# Patient Record
Sex: Male | Born: 1995 | Hispanic: Yes | Marital: Single | Smoking: Never smoker
Health system: Southern US, Community
[De-identification: ages and names within clinical notes are randomized; demographics above are authoritative.]

---
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
## 2020-05-04 ENCOUNTER — Emergency Department (HOSPITAL_COMMUNITY): Payer: No Typology Code available for payment source

## 2020-05-04 ENCOUNTER — Emergency Department (HOSPITAL_COMMUNITY)
Admission: EM | Admit: 2020-05-04 | Discharge: 2020-05-05 | Disposition: A | Discharge status: Home / Self Care | Payer: No Typology Code available for payment source | Attending: Emergency Medicine | Admitting: Emergency Medicine

## 2020-05-04 ENCOUNTER — Encounter (HOSPITAL_COMMUNITY): Payer: Self-pay | Admitting: Emergency Medicine

## 2020-05-04 ENCOUNTER — Other Ambulatory Visit: Payer: Self-pay

## 2020-05-04 DIAGNOSIS — Y929 Unspecified place or not applicable: Secondary | ICD-10-CM | POA: Insufficient documentation

## 2020-05-04 DIAGNOSIS — Z20822 Contact with and (suspected) exposure to covid-19: Secondary | ICD-10-CM | POA: Diagnosis not present

## 2020-05-04 DIAGNOSIS — Y939 Activity, unspecified: Secondary | ICD-10-CM | POA: Insufficient documentation

## 2020-05-04 DIAGNOSIS — S70212A Abrasion, left hip, initial encounter: Secondary | ICD-10-CM | POA: Insufficient documentation

## 2020-05-04 DIAGNOSIS — Y999 Unspecified external cause status: Secondary | ICD-10-CM | POA: Insufficient documentation

## 2020-05-04 DIAGNOSIS — S70312A Abrasion, left thigh, initial encounter: Secondary | ICD-10-CM | POA: Diagnosis not present

## 2020-05-04 DIAGNOSIS — S79912A Unspecified injury of left hip, initial encounter: Secondary | ICD-10-CM | POA: Diagnosis present

## 2020-05-04 DIAGNOSIS — T07XXXA Unspecified multiple injuries, initial encounter: Secondary | ICD-10-CM

## 2020-05-04 LAB — COMPREHENSIVE METABOLIC PANEL
ALT: 41 U/L (ref 0–44)
AST: 26 U/L (ref 15–41)
Albumin: 4.2 g/dL (ref 3.5–5.0)
Alkaline Phosphatase: 71 U/L (ref 38–126)
Anion gap: 9 (ref 5–15)
BUN: 12 mg/dL (ref 6–20)
CO2: 23 mmol/L (ref 22–32)
Calcium: 9.2 mg/dL (ref 8.9–10.3)
Chloride: 104 mmol/L (ref 98–111)
Creatinine, Ser: 0.89 mg/dL (ref 0.61–1.24)
GFR calc Af Amer: >60 mL/min (ref >60)
GFR calc non Af Amer: >60 mL/min (ref >60)
Glucose, Bld: 124 mg/dL — ABNORMAL HIGH (ref 70–99)
Potassium: 3.3 mmol/L — ABNORMAL LOW (ref 3.5–5.1)
Sodium: 136 mmol/L (ref 135–145)
Total Bilirubin: 0.6 mg/dL (ref 0.3–1.2)
Total Protein: 7 g/dL (ref 6.5–8.1)

## 2020-05-04 LAB — ETHANOL: Alcohol, Ethyl (B): <10 mg/dL (ref <10)

## 2020-05-04 LAB — SAMPLE TO BLOOD BANK

## 2020-05-04 LAB — LACTIC ACID, PLASMA: Lactic Acid, Venous: 1.2 mmol/L (ref 0.5–1.9)
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
## 2020-05-05 ENCOUNTER — Other Ambulatory Visit (HOSPITAL_COMMUNITY): Payer: Self-pay

## 2020-05-05 ENCOUNTER — Emergency Department (HOSPITAL_COMMUNITY): Payer: No Typology Code available for payment source

## 2020-05-05 LAB — CBC
HCT: 46.1 % (ref 39.0–52.0)
Hemoglobin: 15.6 g/dL (ref 13.0–17.0)
MCH: 29.8 pg (ref 26.0–34.0)
MCHC: 33.8 g/dL (ref 30.0–36.0)
MCV: 88 fL (ref 80.0–100.0)
Platelets: 291 10*3/uL (ref 150–400)
RBC: 5.24 MIL/uL (ref 4.22–5.81)
RDW: 12.2 % (ref 11.5–15.5)
WBC: 8.5 10*3/uL (ref 4.0–10.5)
nRBC: 0 % (ref 0.0–0.2)

## 2020-05-05 LAB — URINALYSIS, ROUTINE W REFLEX MICROSCOPIC
Bilirubin Urine: NEGATIVE
Glucose, UA: NEGATIVE mg/dL
Hgb urine dipstick: NEGATIVE
Ketones, ur: NEGATIVE mg/dL
Leukocytes,Ua: NEGATIVE
Nitrite: NEGATIVE
Protein, ur: NEGATIVE mg/dL
Specific Gravity, Urine: 1.009 (ref 1.005–1.030)
pH: 7 (ref 5.0–8.0)

## 2020-05-05 LAB — SARS CORONAVIRUS 2 BY RT PCR (HOSPITAL ORDER, PERFORMED IN ~~LOC~~ HOSPITAL LAB): SARS Coronavirus 2: NEGATIVE

## 2020-05-05 LAB — PROTIME-INR
INR: 1 (ref 0.8–1.2)
Prothrombin Time: 12.7 seconds (ref 11.4–15.2)

## 2020-05-05 MED ORDER — IBUPROFEN 800 MG PO TABS: 800.0000 mg | ORAL_TABLET | Freq: Three times a day (TID) | ORAL | 0 refills | Status: AC

## 2020-05-05 MED ORDER — OXYCODONE-ACETAMINOPHEN 5-325 MG PO TABS
1.0000 | ORAL_TABLET | ORAL | 0 refills | Status: AC | PRN
Start: 2020-05-05 — End: ?

## 2020-05-05 MED ORDER — IOHEXOL 300 MG/ML  SOLN
100.0000 mL | Freq: Once | INTRAMUSCULAR | Status: AC | PRN
  Administered 2020-05-05: 100 mL via INTRAVENOUS

## 2020-05-05 NOTE — ED Notes (Signed)
Pt ambulated out into the hallway with strong steady gait.

## 2020-05-05 NOTE — ED Notes (Signed)
Patient transported to CT scan . 

## 2020-05-05 NOTE — Discharge Instructions (Signed)
Your scans did not show any internal injuries or broken bones. You will likely be sore for a few days which is expected. Take the medication as prescribed.  Use caution with pain medicine, it can make you drowsy. Follow-up with your primary care doctor. Return to the ED for new or worsening symptoms.
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
